# Patient Record
Sex: Female | Born: 2000 | Race: Black or African American | Hispanic: No | Marital: Single | Smoking: Never smoker
Health system: Southern US, Community
[De-identification: ages and names within clinical notes are randomized; demographics above are authoritative.]

## PROBLEM LIST (undated history)

## (undated) DIAGNOSIS — N946 Dysmenorrhea, unspecified: Secondary | ICD-10-CM

## (undated) MED ORDER — ONDANSETRON HCL 4 MG TAB
4 mg | Freq: Three times a day (TID) | ORAL | Status: DC | PRN
Start: ? — End: 2013-11-09

---
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
## 2021-01-06 ENCOUNTER — Telehealth: Payer: Medicaid Other | Admitting: Emergency Medicine

## 2021-01-06 DIAGNOSIS — R0989 Other specified symptoms and signs involving the circulatory and respiratory systems: Secondary | ICD-10-CM

## 2021-01-06 NOTE — Progress Notes (Signed)
Virtual Visit Consent   Emma Stewart, you are scheduled for a virtual visit with a [facility] provider today.     Just as with appointments in the office, your consent must be obtained to participate.  Your consent will be active for this visit and any virtual visit you may have with one of our providers in the next 365 days.     If you have a MyChart account, a copy of this consent can be sent to you electronically.  All virtual visits are billed to your insurance company just like a traditional visit in the office.    As this is a virtual visit, video technology does not allow for your provider to perform a traditional examination.  This may limit your provider's ability to fully assess your condition.  If your provider identifies any concerns that need to be evaluated in person or the need to arrange testing (such as labs, EKG, etc.), we will make arrangements to do so.     Although advances in technology are sophisticated, we cannot ensure that it will always work on either your end or our end.  If the connection with a video visit is poor, the visit may have to be switched to a telephone visit.  With either a video or telephone visit, we are not always able to ensure that we have a secure connection.     I need to obtain your verbal consent now.   Are you willing to proceed with your visit today?    Emma Stewart has provided verbal consent on 01/06/2021 for a virtual visit (video or telephone).   Cathlyn Parsons, NP   Date: 01/06/2021 11:13 AM   Virtual Visit via Video Note   I, Cathlyn Parsons, connected with  Emma Stewart  ([id number], [birth year]) on 01/06/21 at 10:30 AM EST by a video-enabled telemedicine application and verified that I am speaking with the correct person using two identifiers.  Location: Patient: Virtual Visit Location Patient: Home Provider: Virtual Visit Location Provider: Home Office   I discussed the limitations of evaluation and management by telemedicine  and the availability of in person appointments. The patient expressed understanding and agreed to proceed.    History of Present Illness: Emma Stewart is a 20 y.o. who identifies as a female who was assigned female at birth, and is being seen today for cough, congestion, sore throat, body aches, and fever and chills.  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  She was fine until after work on 01/02/2021 when she began having symptoms abruptly.  She suspects she may have influenza.  She has been treating symptoms with Mucinex and hot tea.  She does not have a thermometer but feels she is hot to the touch yet has chills so she believes she has a fever.  Denies shortness of breath or wheezing.  HPI: HPI  Problems: There are no problems to display for this patient.   Allergies: No Known Allergies Medications:  Current Outpatient Medications:    diphenoxylate-atropine (LOMOTIL) 2.5-0.025 MG tablet, Take 1-2 tablets by mouth 4 (four) times daily as needed for diarrhea or loose stools., Disp: 15 tablet, Rfl: 0   hyoscyamine (LEVSIN SL) 0.125 MG SL tablet, Place 1 tablet (0.125 mg total) under the tongue every 4 (four) hours as needed for cramping (abdominal pain)., Disp: 10 tablet, Rfl: 0   omeprazole (PRILOSEC) 40 MG capsule, Take 1 capsule (40 mg total) by mouth daily., Disp: 30 capsule, Rfl:  0   ondansetron (ZOFRAN) 4 MG tablet, Take 1 tablet (4 mg total) by mouth every 6 (six) hours., Disp: 12 tablet, Rfl: 0  Observations/Objective: Patient is well-developed, well-nourished in no acute distress.  Resting comfortably  at home.  Head is normocephalic, atraumatic.  No labored breathing.  Speech is clear and coherent with logical content.  Patient is alert and oriented at baseline.    Assessment and Plan: 1. Suspected novel influenza A virus infection Reviewed supportive care symptoms.  Patient questions when she is able to go back to work.  Patient can go back when she is fever free  for 24 hours and her symptoms have improved.  She does not have a MyChart account so I will send this information via email and she has agreed to receive information this way.  Follow Up Instructions: I discussed the assessment and treatment plan with the patient. The patient was provided an opportunity to ask questions and all were answered. The patient agreed with the plan and demonstrated an understanding of the instructions.  A copy of instructions were sent to the patient via MyChart unless otherwise noted below.   Patient has requested to receive PHI (AVS, Work Notes, etc) pertaining to this video visit through e-mail as they are currently without active MyChart. They have voiced understand that email is not considered secure and their health information could be viewed by someone other than the patient.   The patient was advised to call back or seek an in-person evaluation if the symptoms worsen or if the condition fails to improve as anticipated.  Time:  I spent 10 minutes with the patient via telehealth technology discussing the above problems/concerns.    Cathlyn Parsons, NP

## 2021-01-06 NOTE — Patient Instructions (Signed)
  [name], thank you for joining Cathlyn Parsons, NP for today's virtual visit.  While this provider is not your primary care provider (PCP), if your PCP is located in our provider database this encounter information will be shared with them immediately following your visit.  Consent: (Patient) Emma Stewart provided verbal consent for this virtual visit at the beginning of the encounter.  Current Medications:  Current Outpatient Medications:    diphenoxylate-atropine (LOMOTIL) 2.5-0.025 MG tablet, Take 1-2 tablets by mouth 4 (four) times daily as needed for diarrhea or loose stools., Disp: 15 tablet, Rfl: 0   hyoscyamine (LEVSIN SL) 0.125 MG SL tablet, Place 1 tablet (0.125 mg total) under the tongue every 4 (four) hours as needed for cramping (abdominal pain)., Disp: 10 tablet, Rfl: 0   omeprazole (PRILOSEC) 40 MG capsule, Take 1 capsule (40 mg total) by mouth daily., Disp: 30 capsule, Rfl: 0   ondansetron (ZOFRAN) 4 MG tablet, Take 1 tablet (4 mg total) by mouth every 6 (six) hours., Disp: 12 tablet, Rfl: 0   Medications ordered in this encounter:  No orders of the defined types were placed in this encounter.    *If you need refills on other medications prior to your next appointment, please contact your pharmacy*  Follow-Up: Call back or seek an in-person evaluation if the symptoms worsen or if the condition fails to improve as anticipated.  Other Instructions Keep using Mucinex as directed on the package and drinking hot tea to help manage her symptoms.  I suggest you also add Tylenol and/or ibuprofen as directed on the package as to help manage her fever and any body aches.  Please buy a thermometer and keep track of your temperature. Your temperature needs to be lower than 100 F for 24 hours without needing to take Tylenol or ibuprofen to keep it down before he can go back to work.  Make sure to drink lots of liquids to stay hydrated.   If you have been instructed to have an  in-person evaluation today at a local Urgent Care facility, please use the link below. It will take you to a list of all of our available [facility], including address, phone number and hours of operation. Please do not delay care.  [facility]  If you or a family member do not have a primary care provider, use the link below to schedule a visit and establish care. When you choose a [facility] primary care physician or advanced practice provider, you gain a long-term partner in health. Find a Primary Care Provider  Learn more about [facility] in-office and virtual care options: [facility] - Get Care Now

## 2021-01-07 ENCOUNTER — Ambulatory Visit: Payer: Medicaid Other | Admitting: Gastroenterology

## 2021-03-06 ENCOUNTER — Encounter (HOSPITAL_BASED_OUTPATIENT_CLINIC_OR_DEPARTMENT_OTHER): Payer: Self-pay

## 2021-03-06 ENCOUNTER — Other Ambulatory Visit: Payer: Self-pay

## 2021-03-06 ENCOUNTER — Emergency Department (HOSPITAL_BASED_OUTPATIENT_CLINIC_OR_DEPARTMENT_OTHER): Payer: Medicaid Other

## 2021-03-06 ENCOUNTER — Emergency Department (HOSPITAL_BASED_OUTPATIENT_CLINIC_OR_DEPARTMENT_OTHER)
Admission: EM | Admit: 2021-03-06 | Discharge: 2021-03-06 | Disposition: A | Discharge status: Home / Self Care | Payer: Medicaid Other | Attending: Emergency Medicine | Admitting: Emergency Medicine

## 2021-03-06 ENCOUNTER — Other Ambulatory Visit (HOSPITAL_BASED_OUTPATIENT_CLINIC_OR_DEPARTMENT_OTHER): Payer: Self-pay

## 2021-03-06 DIAGNOSIS — K529 Noninfective gastroenteritis and colitis, unspecified: Secondary | ICD-10-CM | POA: Diagnosis not present

## 2021-03-06 DIAGNOSIS — R197 Diarrhea, unspecified: Secondary | ICD-10-CM | POA: Diagnosis present

## 2021-03-06 DIAGNOSIS — Z20822 Contact with and (suspected) exposure to covid-19: Secondary | ICD-10-CM | POA: Insufficient documentation

## 2021-03-06 LAB — CBC WITH DIFFERENTIAL/PLATELET
Abs Immature Granulocytes: 0.06 10*3/uL (ref 0.00–0.07)
Basophils Absolute: 0 10*3/uL (ref 0.0–0.1)
Basophils Relative: 0 %
Eosinophils Absolute: 0 10*3/uL (ref 0.0–0.5)
Eosinophils Relative: 0 %
HCT: 38.4 % (ref 36.0–46.0)
Hemoglobin: 12.1 g/dL (ref 12.0–15.0)
Immature Granulocytes: 1 %
Lymphocytes Relative: 4 %
Lymphs Abs: 0.4 10*3/uL — ABNORMAL LOW (ref 0.7–4.0)
MCH: 26 pg (ref 26.0–34.0)
MCHC: 31.5 g/dL (ref 30.0–36.0)
MCV: 82.6 fL (ref 80.0–100.0)
Monocytes Absolute: 0.3 10*3/uL (ref 0.1–1.0)
Monocytes Relative: 3 %
Neutro Abs: 8.3 10*3/uL — ABNORMAL HIGH (ref 1.7–7.7)
Neutrophils Relative %: 92 %
Platelets: 362 10*3/uL (ref 150–400)
RBC: 4.65 MIL/uL (ref 3.87–5.11)
RDW: 15 % (ref 11.5–15.5)
WBC: 9 10*3/uL (ref 4.0–10.5)
nRBC: 0 % (ref 0.0–0.2)

## 2021-03-06 LAB — URINALYSIS, ROUTINE W REFLEX MICROSCOPIC
Bilirubin Urine: NEGATIVE
Glucose, UA: NEGATIVE mg/dL
Hgb urine dipstick: NEGATIVE
Ketones, ur: NEGATIVE mg/dL
Leukocytes,Ua: NEGATIVE
Nitrite: NEGATIVE
Protein, ur: NEGATIVE mg/dL
Specific Gravity, Urine: 1.02 (ref 1.005–1.030)
pH: 8.5 — ABNORMAL HIGH (ref 5.0–8.0)

## 2021-03-06 LAB — COMPREHENSIVE METABOLIC PANEL
ALT: 17 U/L (ref 0–44)
AST: 38 U/L (ref 15–41)
Albumin: 4.1 g/dL (ref 3.5–5.0)
Alkaline Phosphatase: 59 U/L (ref 38–126)
Anion gap: 10 (ref 5–15)
BUN: 11 mg/dL (ref 6–20)
CO2: 22 mmol/L (ref 22–32)
Calcium: 9.3 mg/dL (ref 8.9–10.3)
Chloride: 103 mmol/L (ref 98–111)
Creatinine, Ser: 0.86 mg/dL (ref 0.44–1.00)
GFR, Estimated: >60 mL/min (ref >60)
Glucose, Bld: 131 mg/dL — ABNORMAL HIGH (ref 70–99)
Potassium: 4.7 mmol/L (ref 3.5–5.1)
Sodium: 135 mmol/L (ref 135–145)
Total Bilirubin: 1.6 mg/dL — ABNORMAL HIGH (ref 0.3–1.2)
Total Protein: 8 g/dL (ref 6.5–8.1)

## 2021-03-06 LAB — RESP PANEL BY RT-PCR (FLU A&B, COVID) ARPGX2
Influenza A by PCR: NEGATIVE
Influenza B by PCR: NEGATIVE
SARS Coronavirus 2 by RT PCR: NEGATIVE

## 2021-03-06 LAB — LIPASE, BLOOD: Lipase: 24 U/L (ref 11–51)

## 2021-03-06 LAB — PREGNANCY, URINE: Preg Test, Ur: NEGATIVE

## 2021-03-06 MED ORDER — SODIUM CHLORIDE 0.9 % IV BOLUS
500.0000 mL | Freq: Once | INTRAVENOUS | Status: AC
  Administered 2021-03-06: 500 mL via INTRAVENOUS

## 2021-03-06 MED ORDER — ONDANSETRON HCL 4 MG/2ML IJ SOLN
4.0000 mg | Freq: Once | INTRAMUSCULAR | Status: AC
  Administered 2021-03-06: 4 mg via INTRAVENOUS
  Filled 2021-03-06: qty 2

## 2021-03-06 MED ORDER — ONDANSETRON 8 MG PO TBDP
8.0000 mg | ORAL_TABLET | Freq: Three times a day (TID) | ORAL | 0 refills | Status: DC | PRN
  Filled 2021-03-06: qty 20, 7d supply, fill #0

## 2021-03-06 NOTE — ED Triage Notes (Signed)
Pt c/o abd pain, n/v/d started 3am-NAD-steady gait

## 2021-03-06 NOTE — ED Provider Notes (Signed)
[facility] EMERGENCY DEPARTMENT Provider Note   CSN: [id number] Arrival date & time: 03/06/21  1129     History  Chief Complaint  Patient presents with   Abdominal Pain    Emma Stewart is a 21 y.o. female.   Abdominal Pain Associated symptoms: cough, diarrhea, fever, nausea and vomiting   Associated symptoms: no dysuria    This is a 21 year old female presenting due to nausea, vomiting, diarrhea.  Started acutely at 3 AM, the symptoms have been intermittent but frequent since then.  She has pain in her abdomen when she vomits.  Not on any birth control, denies any sexual activity.  Went out to [employer] had a poke bowl last night, nobody around her is sick. Has not tried any at home medication.   Patient states she had flulike symptoms about 3 weeks ago that have been persistent since then.  Consistent cough, body aches, intermittent fevers.  Mother is concerned about pneumonia, patient denies any chest pain or feeling short of breath.  Social history: Occasional marijuana use.  Denies any tobacco use or alcohol use.  No surgical history, no prior abdominal surgeries.  Home Medications Prior to Admission medications   Medication Sig Start Date End Date Taking? Authorizing Provider  ondansetron (ZOFRAN-ODT) 8 MG disintegrating tablet Take 1 tablet (8 mg total) by mouth every 8 (eight) hours as needed for nausea or vomiting. 03/06/21  Yes Theron Arista, PA-C  diphenoxylate-atropine (LOMOTIL) 2.5-0.025 MG tablet Take 1-2 tablets by mouth 4 (four) times daily as needed for diarrhea or loose stools. 08/18/20   Gilda Crease, MD  hyoscyamine (LEVSIN SL) 0.125 MG SL tablet Place 1 tablet (0.125 mg total) under the tongue every 4 (four) hours as needed for cramping (abdominal pain). 08/18/20   Gilda Crease, MD  omeprazole (PRILOSEC) 40 MG capsule Take 1 capsule (40 mg total) by mouth daily. 11/23/20 12/23/20  Jeannie Fend, PA-C  ondansetron (ZOFRAN) 4 MG  tablet Take 1 tablet (4 mg total) by mouth every 6 (six) hours. 08/18/20   Gilda Crease, MD      Allergies    Patient has no known allergies.    Review of Systems   Review of Systems  Constitutional:  Positive for fever.  HENT:  Positive for congestion.   Respiratory:  Positive for cough.   Gastrointestinal:  Positive for abdominal pain, diarrhea, nausea and vomiting.  Genitourinary:  Negative for dysuria, menstrual problem and pelvic pain.  Musculoskeletal:  Positive for myalgias.  Neurological:  Negative for syncope.   Physical Exam Updated Vital Signs BP 119/61    Pulse 93    Temp 98.2 F (36.8 C) (Oral)    Resp 18    Ht 5\' 3"  (1.6 m)    Wt 86.6 kg    LMP 02/19/2021    SpO2 99%    BMI 33.83 kg/m  Physical Exam Vitals and nursing note reviewed.  Constitutional:      General: She is not in acute distress.    Appearance: She is well-developed.  HENT:     Head: Normocephalic and atraumatic.  Eyes:     Conjunctiva/sclera: Conjunctivae normal.  Cardiovascular:     Rate and Rhythm: Normal rate and regular rhythm.     Heart sounds: No murmur heard. Pulmonary:     Effort: Pulmonary effort is normal. No respiratory distress.     Breath sounds: Normal breath sounds.     Comments: Slightly decreased breath sounds likely secondary to  effort. No wheezes, rales, rhonchi. Abdominal:     Palpations: Abdomen is soft.     Tenderness: There is generalized abdominal tenderness.  Musculoskeletal:        General: No swelling.     Cervical back: Neck supple.  Skin:    General: Skin is warm and dry.     Capillary Refill: Capillary refill takes less than 2 seconds.  Neurological:     Mental Status: She is alert.  Psychiatric:        Mood and Affect: Mood normal.   ED Results / Procedures / Treatments   Labs (all labs ordered are listed, but only abnormal results are displayed) Labs Reviewed  CBC WITH DIFFERENTIAL/PLATELET - Abnormal; Notable for the following components:       Result Value   Neutro Abs 8.3 (*)    Lymphs Abs 0.4 (*)    All other components within normal limits  COMPREHENSIVE METABOLIC PANEL - Abnormal; Notable for the following components:   Glucose, Bld 131 (*)    Total Bilirubin 1.6 (*)    All other components within normal limits  URINALYSIS, ROUTINE W REFLEX MICROSCOPIC - Abnormal; Notable for the following components:   APPearance HAZY (*)    pH 8.5 (*)    All other components within normal limits  RESP PANEL BY RT-PCR (FLU A&B, COVID) ARPGX2  LIPASE, BLOOD  PREGNANCY, URINE    EKG None  Radiology DG Chest Portable 1 View  Result Date: 03/06/2021 CLINICAL DATA:  Cough. Abdominal pain, nausea and vomiting with diarrhea. EXAM: PORTABLE CHEST 1 VIEW COMPARISON:  None. FINDINGS: The heart size and mediastinal contours are within normal limits. Both lungs are clear. The visualized skeletal structures are unremarkable. IMPRESSION: Normal examination. Electronically Signed   By: Beckie SaltsSteven  Reid M.D.   On: 03/06/2021 12:25    Procedures Procedures    Medications Ordered in ED Medications  sodium chloride 0.9 % bolus 500 mL (0 mLs Intravenous Stopped 03/06/21 1314)  ondansetron (ZOFRAN) injection 4 mg (4 mg Intravenous Given 03/06/21 1210)    ED Course/ Medical Decision Making/ A&P                           Medical Decision Making  This is a 21 year old female presenting with nausea, diarrhea, vomiting, diffuse abdominal pain times less than 12 hours.  Presentation seems consistent with a gastroenteritis, her vitals are stable and there is no focal tenderness on exam.  She is not tachycardic or febrile or hypoxic, not having any chest pain or shortness of breath.  Given that she has been having cough and flu symptoms for greater than 3 weeks we will get a chest x-ray to evaluate for pneumonia.  We will also treat with fluids and nausea medicine and check abdominal labs for gross electrolyte derangement as well as signs for infection, AKI or  LFT derangement.  Do not think she needs imaging at this time, will continue to do serial abdominal exams.  I considered cannabis hyperemesis given daily marijuana use, however in the context of diarrhea and vomiting and new food ingestion I suspect gastroenteritis is more likely.  Additional history obtained: -Additional history obtained from patient mother who is at bedside. -External records from outside source obtained and reviewed including: Chart review including previous notes, labs, imaging, consultation notes   Lab Tests: -I ordered, reviewed, and interpreted labs.  The pertinent results include:   CBC without any leukocytosis or anemia. CMP  does not show any AKI, electrolyte derangement, LFT derangement.  Total bilirubin is slightly elevated but there is no LFT pattern concerning for an acute hepatitis.  Do not think this needs emergent work-up at this time. UA unremarkable, patient is not pregnant. Respiratory panel: COVID and flu negative. Lipase: Negative, also not consistent with her presentation given she is not an alcohol drinker.  Imaging Studies ordered: -I ordered imaging studies including chest x-ray -I independently visualized and interpreted imaging which showed normal cardiac silhouette without any evidence of pneumonia. -I agree with the radiologist interpretation   Medicines ordered and prescription drug management: -I ordered medication including Zofran for nausea.  Normal saline for fluid loss replenishment. -Reevaluation of the patient after these medicines showed that the patient resolved -I have reviewed the patients home medicines and have made adjustments as needed  Reevaluation: After the interventions noted above, I reevaluated the patient and found that they have :resolved Serial abdominal exams with out any focal tenderness, abdomen remained soft without peritoneal signs concerning for acute abdomen.  Reassuring. Patient passed p.o.  challenge.  Dispostion: Zofran prescribed for nausea.  Work note provided.  Patient discharged with dietary recommendations, return precautions in stable condition.        Final Clinical Impression(s) / ED Diagnoses Final diagnoses:  Gastroenteritis    Rx / DC Orders ED Discharge Orders          Ordered    ondansetron (ZOFRAN-ODT) 8 MG disintegrating tablet  Every 8 hours PRN        03/06/21 1338              Theron Arista, PA-C 03/06/21 1346    Curatolo, Adam, DO 03/06/21 1400

## 2021-03-06 NOTE — Discharge Instructions (Addendum)
Your work-up today was reassuring, no signs of infection or pneumonia on your chest x-ray.  Drink plenty of clear liquid and eat a plain diet until you feel better. Take zofran every 8 hours as needed for nausea. Stay out of wok until diarrhea free for 24 hours.

## 2021-03-27 ENCOUNTER — Encounter: Payer: Self-pay | Admitting: Nurse Practitioner

## 2021-04-10 ENCOUNTER — Other Ambulatory Visit: Payer: Self-pay

## 2021-04-11 ENCOUNTER — Other Ambulatory Visit: Payer: Self-pay

## 2021-04-13 NOTE — Progress Notes (Addendum)
04/13/2021 Danie Binder [id number] 2000/11/27   CHIEF COMPLAINT: Abdominal pain, diarrhea  HISTORY OF PRESENT ILLNESS: Lisandra Mathisen is a 21 year old female with a past medical history of anxiety, depression and chronic headaches. She presents to our office today as referred by Suella Broad PA-C for further evaluation regarding upper abdominal pain, nausea/vomiting and diarrhea.  She is accompanied by her mother.  She reports having stomach issues since the age 86 including upper abdominal pain, intermittent nausea, vomiting and diarrhea which initially occurred during her menstrual cycle.  However, over the past few years these symptoms have worsened.  She often has urgent diarrhea bowel movements triggered by eating.  No specific food triggers.  Her episodes of N/V are intermittent, typically occur once or twice monthly.  She vomited orange bilious emesis x 5 episodes over the past 3 weeks.  No coffee-ground or frank hematemesis.  She is taking Zofran 4 mg daily and Phenergan as needed.  No dysphagia or heartburn.  Her upper abdominal pain and diarrhea occur daily for the past year.  She has intermittent lower abdominal pain when passing a diarrhea bowel movement.  She cannot recall the last time she passed a solid stool.  No rectal bleeding or black stools.  She has a history of headaches and often has nausea before or during the duration of her headaches.  She intends to see neurologist to verify if she has migraine headaches.  No NSAID use.  She smokes marijuana twice weekly for the past year.  No alcohol use. She was previously on Prozac for anxiety and depression and she stated her GI symptoms did not improve or worsen when she was on Prozac.  She stopped taking Prozac approximately 6 months ago and feels her anxiety and depression are well controlled at this time.   She presented to the ED 11/23/2020 due to having worsening N/V, epigastric pain and diarrhea.  Labs showed a sodium level 139.   Potassium 3.5.  BUN 7.  Creatinine 0.82.  AST 14.  ALT 8.  Total bili 0.3.  Alk phos 60.  WBC 5.9.  Hemoglobin 12.0.  She was prescribed Omeprazole 12m QD and a GI evaluation was recommended.   She presented to the ED on 03/06/2021 with N/V/D and abdominal pain which started acutely at 3 AM..  Labs in the ED showed a total bilirubin level 1.6.  Alk phos 59.  AST 38.  ALT 17.  Lipase 24.  Glucose 131.  WBC 9.0.  Hemoglobin 12.1.  Urine pregnancy test was negative.  She also reported having a cough and flulike symptoms.  COVID and influenza testing were negative.  A chest x-ray was negative.  She was prescribed Levsin, Lomotil, Zofran with instructions to continue Omeprazole 40 mg daily.  Abdominal imaging was not done.  Omeprazole was ineffective.   CBC Latest Ref Rng & Units 03/06/2021 11/23/2020 08/18/2020  WBC 4.0 - 10.5 K/uL 9.0 5.9 5.9  Hemoglobin 12.0 - 15.0 g/dL 12.1 12.0 12.2  Hematocrit 36.0 - 46.0 % 38.4 38.0 39.8  Platelets 150 - 400 K/uL 362 358 354    CMP Latest Ref Rng & Units 03/06/2021 11/23/2020 08/18/2020  Glucose 70 - 99 mg/dL 131(H) 95 116(H)  BUN 6 - 20 mg/dL _0 Creatinine 0.44 - 1.00 mg/dL 0.86 0.82 0.78  Sodium 135 - 145 mmol/L 135 139 141  Potassium 3.5 - 5.1 mmol/L 4.7 3.5 4.0  Chloride 98 - 111 mmol/L 103 104 108  CO2 22 - 32 mmol/L _0 Calcium 8.9 - 10.3 mg/dL 9.3 9.8 9.6  Total Protein 6.5 - 8.1 g/dL 8.0 7.7 7.6  Total Bilirubin 0.3 - 1.2 mg/dL 1.6(H) 0.3 0.2(L)  Alkaline Phos 38 - 126 U/L 59 60 59  AST 15 - 41 U/L 38 14(L) 18  ALT 0 - 44 U/L _1 Social History: She is single.  [employer].  No alcohol use. She smokes marijuana a few days weekly x 1. No other drug use.   Family History: Mother age 5 with history of heart disease, hypertension, hypercholesterolemia and osteoarthritis. Father's history unknown. Sister 24 PCOS. Sister 23 healthy.  No Known Allergies    Outpatient Encounter Medications as of 04/14/2021  Medication  Sig   diphenoxylate-atropine (LOMOTIL) 2.5-0.025 MG tablet Take 1-2 tablets by mouth 4 (four) times daily as needed for diarrhea or loose stools.   hyoscyamine (LEVSIN SL) 0.125 MG SL tablet Place 1 tablet (0.125 mg total) under the tongue every 4 (four) hours as needed for cramping (abdominal pain).   omeprazole (PRILOSEC) 40 MG capsule Take 1 capsule (40 mg total) by mouth daily.   ondansetron (ZOFRAN-ODT) 8 MG disintegrating tablet Take 1 tablet (8 mg total) by mouth every 8 (eight) hours as needed for nausea or vomiting.   promethazine (PHENERGAN) 12.5 MG tablet Take 12.5 mg by mouth.   No facility-administered encounter medications on file as of 04/14/2021.    REVIEW OF SYSTEMS:  Gen: Denies fever, sweats or chills. No weight loss.  CV: Denies chest pain, palpitations or edema. Resp: Denies cough, shortness of breath of hemoptysis.  GI: See HPI. GU : Denies urinary burning, blood in urine, increased urinary frequency or incontinence. MS: Denies joint pain, muscles aches or weakness. Derm: Denies rash, itchiness, skin lesions or unhealing ulcers. Psych:+ Anxiety and depression.   Heme: Denies bruising, easy bleeding. Neuro: + Headaches. Endo:  Denies any problems with DM, thyroid or adrenal function.  PHYSICAL EXAM: BP 102/60    Pulse 81    Ht _2  (1.626 m)    Wt 195 lb (88.5 kg)    SpO2 99%    BMI 33.47 kg/m   General: 21 year old female in no acute distress. Head: Normocephalic and atraumatic. Eyes:  Sclerae non-icteric, conjunctive pink. Ears: Normal auditory acuity. Mouth: Dentition intact. No ulcers or lesions.  Neck: Supple, no lymphadenopathy or thyromegaly.  Lungs: Clear bilaterally to auscultation without wheezes, crackles or rhonchi. Heart: Regular rate and rhythm. No murmur, rub or gallop appreciated.  Abdomen: Soft, non distended.  Mild epigastric tenderness without rebound or guarding.  No masses. No hepatosplenomegaly. Normoactive bowel sounds x 4 quadrants.   Rectal: Deferred. Musculoskeletal: Symmetrical with no gross deformities. Skin: Warm and dry. No rash or lesions on visible extremities. Extremities: No edema. Neurological: Alert oriented x 4, no focal deficits.  Psychological:  Alert and cooperative. Normal mood and affect.  ASSESSMENT AND PLAN:  31) 21 year old female with chronic episodic epigastric pain with N/V.  Mildly elevated total bilirubin level 1.6.  Normal alk phos, AST and ALT levels. -RUQ sonogram to evaluate the gallbladder -Famotidine 20 mg 1 p.o. daily, patient to start after H. pylori stool test submitted to the lab -H. pylori stool antigen -Eventual EGD -Ondansetron 4 mg p.o. every 6 to 8 hours as needed. -Patient to contact her office if her symptoms worsen.  Repeat hepatic panel if vomiting recurs. -Stop marijuana use -Avoid spicy and greasy foods -  Patient reported completing laboratory studies including a CBC, CMP and TSH level by her PCP 1 or 2 weeks ago, I have requested a copy of these results for further review  2) Chronic diarrhea with intermittent lower abdominal pain  -Fecal calprotectin level -Dicyclomine 10 mg 1 p.o. 3 times daily, may take 30 minutes prior to meals -Eventual diagnostic colonoscopy rule out colitis/IBD  Further recommendations to be determined after the above lab and sonogram results reviewed  Danie Binder will follow-up with Dr. Lorenso Courier in 4 weeks to further discuss scheduling an EGD and colonoscopy.    CC:  Benito Mccreedy, MD

## 2021-04-14 ENCOUNTER — Ambulatory Visit (INDEPENDENT_AMBULATORY_CARE_PROVIDER_SITE_OTHER): Payer: Medicaid Other | Admitting: Nurse Practitioner

## 2021-04-14 ENCOUNTER — Encounter: Payer: Self-pay | Admitting: Nurse Practitioner

## 2021-04-14 ENCOUNTER — Other Ambulatory Visit: Payer: Medicaid Other

## 2021-04-14 VITALS — BP 102/60 | HR 81 | Ht 64.0 in | Wt 195.0 lb

## 2021-04-14 DIAGNOSIS — R197 Diarrhea, unspecified: Secondary | ICD-10-CM | POA: Diagnosis not present

## 2021-04-14 DIAGNOSIS — K529 Noninfective gastroenteritis and colitis, unspecified: Secondary | ICD-10-CM

## 2021-04-14 DIAGNOSIS — R1010 Upper abdominal pain, unspecified: Secondary | ICD-10-CM

## 2021-04-14 DIAGNOSIS — R112 Nausea with vomiting, unspecified: Secondary | ICD-10-CM | POA: Diagnosis not present

## 2021-04-14 MED ORDER — DICYCLOMINE HCL 10 MG PO CAPS: 10.0000 mg | ORAL_CAPSULE | Freq: Three times a day (TID) | ORAL | 0 refills | Status: AC | PRN

## 2021-04-14 MED ORDER — FAMOTIDINE 20 MG PO TABS: 20.0000 mg | ORAL_TABLET | Freq: Every day | ORAL | 1 refills | Status: AC

## 2021-04-14 NOTE — Progress Notes (Signed)
RADIOLOGY SCHEDULING REQUEST FOR RUQ US °[facility] Scheduling via secure staff message. ° ° ° °

## 2021-04-14 NOTE — Patient Instructions (Addendum)
LABS:   Please proceed to the basement level to pick up stools tests before leaving today. Press "B" on the elevator. The lab is located at the first door on the left as you exit the elevator.  IMAGING: You will be contacted by [facility] Scheduling (Your caller ID will indicate phone # [phone]) in the next 7 days to schedule your Abdominal ultrasound. If you have not heard from them within 7 business days, please call [facility] Scheduling at [phone] to follow up on the status of your appointment.    MEDICATION: We have sent the following medication to your pharmacy for you to pick up at your convenience: Famotidine 20 MG tablet. Take 1 tablet daily after you have sent in your stool tests. Dicyclomine 10 MG tablet, take 1 tablet three times a day as needed for abdominal pain/cramping. You may take this 3o minutes before meals as needed.  RECOMMENDATIONS:  Stop marijuana use. Follow up with Dr. Leonides Schanz.  We have scheduled you a follow up with Dr. Leonides Schanz on 05/21/21 at 3:40pm.  BMI:  If you are age 16 or older, your body mass index should be between 23-30. Your Body mass index is 33.47 kg/m. If this is out of the aforementioned range listed, please consider follow up with your Primary Care Provider.  If you are age 8 or younger, your body mass index should be between 19-25. Your Body mass index is 33.47 kg/m. If this is out of the aformentioned range listed, please consider follow up with your Primary Care Provider.   MY CHART:  The [place] [facility] providers would like to encourage you to use [facility] to communicate with providers for non-urgent requests or questions.  Due to long hold times on the telephone, sending your provider a message by [facility] may be a faster and more efficient way to get a response.  Please allow 48 business hours for a response.  Please remember that this is for non-urgent requests.   Thank you for trusting me with your gastrointestinal  care!    Arnaldo Natal, CRNP

## 2021-04-22 ENCOUNTER — Ambulatory Visit (HOSPITAL_COMMUNITY): Payer: Medicaid Other

## 2021-04-23 ENCOUNTER — Telehealth: Payer: Self-pay | Admitting: Internal Medicine

## 2021-04-23 NOTE — Telephone Encounter (Signed)
Called patient, no answer and VM is full. ?  ?If she calls back before I can get in touch, Please have patient call radiology at [phone] to reschedule her ultrasound ?

## 2021-04-23 NOTE — Telephone Encounter (Signed)
Patient called to reschedule US.

## 2021-04-29 NOTE — Telephone Encounter (Signed)
Called patient, no answer and VM is full. ?  ?If she calls back before I can get in touch, Please have patient call radiology at [phone] to reschedule her ultrasound ?

## 2021-05-05 ENCOUNTER — Ambulatory Visit (HOSPITAL_COMMUNITY): Payer: Medicaid Other

## 2021-05-21 ENCOUNTER — Ambulatory Visit: Payer: Medicaid Other | Admitting: Internal Medicine

## 2021-06-06 ENCOUNTER — Ambulatory Visit: Payer: Medicaid Other | Admitting: Internal Medicine

## 2022-04-17 ENCOUNTER — Emergency Department (HOSPITAL_COMMUNITY)
Admission: EM | Admit: 2022-04-17 | Discharge: 2022-04-18 | Disposition: A | Discharge status: Home / Self Care | Payer: Medicaid Other | Attending: Emergency Medicine | Admitting: Emergency Medicine

## 2022-04-17 ENCOUNTER — Other Ambulatory Visit: Payer: Self-pay

## 2022-04-17 ENCOUNTER — Encounter (HOSPITAL_COMMUNITY): Payer: Self-pay | Admitting: Emergency Medicine

## 2022-04-17 DIAGNOSIS — R519 Headache, unspecified: Secondary | ICD-10-CM | POA: Diagnosis not present

## 2022-04-17 DIAGNOSIS — R197 Diarrhea, unspecified: Secondary | ICD-10-CM | POA: Diagnosis not present

## 2022-04-17 DIAGNOSIS — R1084 Generalized abdominal pain: Secondary | ICD-10-CM | POA: Insufficient documentation

## 2022-04-17 DIAGNOSIS — R112 Nausea with vomiting, unspecified: Secondary | ICD-10-CM | POA: Diagnosis not present

## 2022-04-17 DIAGNOSIS — K529 Noninfective gastroenteritis and colitis, unspecified: Secondary | ICD-10-CM

## 2022-04-17 LAB — CBC WITH DIFFERENTIAL/PLATELET
Abs Immature Granulocytes: 0.02 10*3/uL (ref 0.00–0.07)
Basophils Absolute: 0 10*3/uL (ref 0.0–0.1)
Basophils Relative: 0 %
Eosinophils Absolute: 0 10*3/uL (ref 0.0–0.5)
Eosinophils Relative: 0 %
HCT: 38.4 % (ref 36.0–46.0)
Hemoglobin: 12.1 g/dL (ref 12.0–15.0)
Immature Granulocytes: 0 %
Lymphocytes Relative: 5 %
Lymphs Abs: 0.6 10*3/uL — ABNORMAL LOW (ref 0.7–4.0)
MCH: 26.8 pg (ref 26.0–34.0)
MCHC: 31.5 g/dL (ref 30.0–36.0)
MCV: 85.1 fL (ref 80.0–100.0)
Monocytes Absolute: 0.2 10*3/uL (ref 0.1–1.0)
Monocytes Relative: 2 %
Neutro Abs: 10.4 10*3/uL — ABNORMAL HIGH (ref 1.7–7.7)
Neutrophils Relative %: 93 %
Platelets: 343 10*3/uL (ref 150–400)
RBC: 4.51 MIL/uL (ref 3.87–5.11)
RDW: 14 % (ref 11.5–15.5)
WBC: 11.2 10*3/uL — ABNORMAL HIGH (ref 4.0–10.5)
nRBC: 0 % (ref 0.0–0.2)

## 2022-04-17 LAB — COMPREHENSIVE METABOLIC PANEL
ALT: 19 U/L (ref 0–44)
AST: 29 U/L (ref 15–41)
Albumin: 4.2 g/dL (ref 3.5–5.0)
Alkaline Phosphatase: 58 U/L (ref 38–126)
Anion gap: 10 (ref 5–15)
BUN: 11 mg/dL (ref 6–20)
CO2: 23 mmol/L (ref 22–32)
Calcium: 9.2 mg/dL (ref 8.9–10.3)
Chloride: 103 mmol/L (ref 98–111)
Creatinine, Ser: 0.93 mg/dL (ref 0.44–1.00)
GFR, Estimated: >60 mL/min (ref >60)
Glucose, Bld: 109 mg/dL — ABNORMAL HIGH (ref 70–99)
Potassium: 3.7 mmol/L (ref 3.5–5.1)
Sodium: 136 mmol/L (ref 135–145)
Total Bilirubin: 0.9 mg/dL (ref 0.3–1.2)
Total Protein: 7.3 g/dL (ref 6.5–8.1)

## 2022-04-17 LAB — LIPASE, BLOOD: Lipase: 71 U/L — ABNORMAL HIGH (ref 11–51)

## 2022-04-17 MED ORDER — DIPHENHYDRAMINE HCL 25 MG PO CAPS
25.0000 mg | ORAL_CAPSULE | Freq: Once | ORAL | Status: AC
  Administered 2022-04-17: 25 mg via ORAL
  Filled 2022-04-17: qty 1

## 2022-04-17 MED ORDER — METOCLOPRAMIDE HCL 5 MG/ML IJ SOLN
10.0000 mg | Freq: Once | INTRAMUSCULAR | Status: AC
  Administered 2022-04-17: 10 mg via INTRAVENOUS
  Filled 2022-04-17: qty 2

## 2022-04-17 MED ORDER — ONDANSETRON HCL 4 MG/2ML IJ SOLN
4.0000 mg | Freq: Once | INTRAMUSCULAR | Status: AC
  Administered 2022-04-17: 4 mg via INTRAVENOUS
  Filled 2022-04-17: qty 2

## 2022-04-17 MED ORDER — SODIUM CHLORIDE 0.9 % IV BOLUS
1000.0000 mL | Freq: Once | INTRAVENOUS | Status: AC
  Administered 2022-04-17: 1000 mL via INTRAVENOUS

## 2022-04-17 MED ORDER — DICYCLOMINE HCL 10 MG PO CAPS
10.0000 mg | ORAL_CAPSULE | Freq: Once | ORAL | Status: AC
  Administered 2022-04-17: 10 mg via ORAL
  Filled 2022-04-17: qty 1

## 2022-04-17 MED ORDER — BUTALBITAL-APAP-CAFFEINE 50-325-40 MG PO TABS
1.0000 | ORAL_TABLET | Freq: Once | ORAL | Status: AC
  Administered 2022-04-17: 1 via ORAL
  Filled 2022-04-17: qty 1

## 2022-04-17 NOTE — ED Provider Notes (Signed)
[place] AT [facility] Provider Note   CSN: [id number] Arrival date & time: 04/17/22  2148     History  Chief Complaint  Patient presents with   Emesis    Emma Stewart is a 22 y.o. female. With no reported past medical history who presents to the emergency department with emesis.  States that symptoms began at [place] she has had >10 episodes of nausea and vomiting, >10 episodes of nonbloody diarrhea. She describes having generalized abdominal cramping. She states she has not been able to tolerate PO today. She additionally states that she has started to have migraine headache since onset of symptoms. She has history of same. She denies having fever, dysuria, vaginal discharge. She denies possibly contaminated foods. She denies recent antibiotic use or travel. States that her roommate had same illness about 48 hours prior.    Emesis Associated symptoms: abdominal pain, diarrhea and headaches        Home Medications Prior to Admission medications   Medication Sig Start Date End Date Taking? Authorizing Provider  dicyclomine (BENTYL) 10 MG capsule Take 1 capsule (10 mg total) by mouth 3 (three) times daily as needed for spasms (abdominal pain). 04/14/21   Noralyn Pick, NP  diphenoxylate-atropine (LOMOTIL) 2.5-0.025 MG tablet Take 1-2 tablets by mouth 4 (four) times daily as needed for diarrhea or loose stools. 08/18/20   Orpah Greek, MD  famotidine (PEPCID) 20 MG tablet Take 1 tablet (20 mg total) by mouth at bedtime. 04/14/21   Noralyn Pick, NP  omeprazole (PRILOSEC) 40 MG capsule Take 1 capsule (40 mg total) by mouth daily. 11/23/20 12/23/20  Tacy Learn, PA-C  ondansetron (ZOFRAN-ODT) 8 MG disintegrating tablet Take 1 tablet (8 mg total) by mouth every 8 (eight) hours as needed for nausea or vomiting. 03/06/21   Sherrill Raring, PA-C  promethazine (PHENERGAN) 12.5 MG tablet Take 12.5 mg by mouth. 03/28/21    [provider]      Allergies    Patient has no known allergies.    Review of Systems   Review of Systems  Constitutional:  Positive for appetite change.  Gastrointestinal:  Positive for abdominal pain, diarrhea, nausea and vomiting.  Neurological:  Positive for headaches.  All other systems reviewed and are negative.   Physical Exam Updated Vital Signs BP (!) 216/91 (BP Location: Right Arm)   Pulse 91   Temp 98.9 F (37.2 C) (Oral)   Resp 18   SpO2 99%  Physical Exam Vitals and nursing note reviewed.  Constitutional:      General: She is not in acute distress.    Appearance: Normal appearance. She is ill-appearing. She is not toxic-appearing.  HENT:     Head: Normocephalic.     Mouth/Throat:     Mouth: Mucous membranes are moist.     Pharynx: Oropharynx is clear.  Eyes:     General: No scleral icterus. Cardiovascular:     Rate and Rhythm: Normal rate and regular rhythm.     Pulses: Normal pulses.     Heart sounds: No murmur heard. Pulmonary:     Effort: Pulmonary effort is normal. No respiratory distress.     Breath sounds: Normal breath sounds.  Abdominal:     General: Abdomen is protuberant. Bowel sounds are normal. There is no distension.     Palpations: Abdomen is soft.     Tenderness: There is generalized abdominal tenderness. There is no guarding or rebound.  Musculoskeletal:  Cervical back: Neck supple.  Skin:    General: Skin is warm and dry.     Capillary Refill: Capillary refill takes less than 2 seconds.  Neurological:     General: No focal deficit present.     Mental Status: She is alert.  Psychiatric:        Mood and Affect: Mood normal.        Behavior: Behavior normal.     ED Results / Procedures / Treatments   Labs (all labs ordered are listed, but only abnormal results are displayed) Labs Reviewed  COMPREHENSIVE METABOLIC PANEL  LIPASE, BLOOD  CBC WITH DIFFERENTIAL/PLATELET  URINALYSIS, ROUTINE W REFLEX MICROSCOPIC   I-STAT BETA HCG BLOOD, ED ([facility], [facility], [facility] ONLY)    EKG None  Radiology No results found.  Procedures Procedures   Medications Ordered in ED Medications  sodium chloride 0.9 % bolus 1,000 mL (has no administration in time range)  ondansetron (ZOFRAN) injection 4 mg (has no administration in time range)  dicyclomine (BENTYL) capsule 10 mg (has no administration in time range)  butalbital-acetaminophen-caffeine (FIORICET) 50-325-40 MG per tablet 1 tablet (has no administration in time range)  diphenhydrAMINE (BENADRYL) capsule 25 mg (has no administration in time range)    ED Course/ Medical Decision Making/ A&P   {   Click here for ABCD2, HEART and other calculatorsREFRESH Note before signing :1}                          Medical Decision Making Amount and/or Complexity of Data Reviewed Labs: ordered.  Risk Prescription drug management.  Initial Impression and Ddx *** Patient PMH that increases complexity of ED encounter:  ***  Interpretation of Diagnostics I independent reviewed and interpreted the labs as followed: ***  - I independently visualized the following imaging with scope of interpretation limited to determining acute life threatening conditions related to emergency care: ***, which revealed ***  Patient Reassessment and Ultimate Disposition/Management ***  Patient management required discussion with the following services or consulting groups:  {BEROCONSULT:26841}  Complexity of Problems Addressed {BEROCOPA:26833}  Additional Data Reviewed and Analyzed Further history obtained from: {BERODATA:26834}  Patient Encounter Risk Assessment {BERORISK:26838}  Final Clinical Impression(s) / ED Diagnoses Final diagnoses:  None    Rx / DC Orders ED Discharge Orders     None

## 2022-04-17 NOTE — ED Triage Notes (Signed)
N/V/D and headache since 3 pm today.  Housemate with reportedly same illness 2 days ago.

## 2022-04-18 LAB — URINALYSIS, ROUTINE W REFLEX MICROSCOPIC
Bilirubin Urine: NEGATIVE
Glucose, UA: NEGATIVE mg/dL
Hgb urine dipstick: NEGATIVE
Ketones, ur: 20 mg/dL — AB
Leukocytes,Ua: NEGATIVE
Nitrite: NEGATIVE
Protein, ur: NEGATIVE mg/dL
Specific Gravity, Urine: 1.008 (ref 1.005–1.030)
pH: 8 (ref 5.0–8.0)

## 2022-04-18 LAB — HCG, QUANTITATIVE, PREGNANCY: hCG, Beta Chain, Quant, S: <1 m[IU]/mL (ref <5)

## 2022-04-18 MED ORDER — ONDANSETRON 8 MG PO TBDP
8.0000 mg | ORAL_TABLET | Freq: Three times a day (TID) | ORAL | 0 refills | Status: AC | PRN
Start: 2022-04-18 — End: ?

## 2022-04-18 NOTE — Discharge Instructions (Addendum)
Avoid fried foods, fatty foods, greasy foods, and milk products until symptoms resolve. Drink plenty of clear liquids to prevent dehydration. We recommend the use of Zofran as prescribed for nausea/vomiting. Follow-up with your primary care doctor to ensure resolution of symptoms.

## 2022-04-18 NOTE — ED Provider Notes (Signed)
1:28 AM Patient care assumed from [place], PA-C at change of shift.  In short, patient presenting for vomiting and diarrhea.  Her labs have been reviewed.  She has minimal lipase elevation to 71.  Mild leukocytosis of 11.2.  Suspect leukocytosis to be secondary to stress of ongoing vomiting.  This may have also caused changes to the patient's lipase.  She has preserved electrolytes, liver function, kidney function.  On repeat assessment, she reports that her pain has improved.  She is no longer nauseous.  She has tolerated oral fluids as well as saltine crackers.  Her abdominal exam is nontender.  Abdomen soft without peritoneal signs.  Clinical picture consistent with gastroenteritis.  Plan for discharge for outpatient primary care follow-up.  Given outpatient prescription for Zofran for ongoing nausea management.  Counseled on dietary restrictions until symptoms resolve.  Return precautions discussed and provided. Patient discharged in stable condition with no unaddressed concerns.   Results for orders placed or performed during the hospital encounter of 04/17/22  Comprehensive metabolic panel  Result Value Ref Range   Sodium 136 135 - 145 mmol/L   Potassium 3.7 3.5 - 5.1 mmol/L   Chloride 103 98 - 111 mmol/L   CO2 23 22 - 32 mmol/L   Glucose, Bld 109 (H) 70 - 99 mg/dL   BUN 11 6 - 20 mg/dL   Creatinine, Ser 0.93 0.44 - 1.00 mg/dL   Calcium 9.2 8.9 - 10.3 mg/dL   Total Protein 7.3 6.5 - 8.1 g/dL   Albumin 4.2 3.5 - 5.0 g/dL   AST 29 15 - 41 U/L   ALT 19 0 - 44 U/L   Alkaline Phosphatase 58 38 - 126 U/L   Total Bilirubin 0.9 0.3 - 1.2 mg/dL   GFR, Estimated >60 >60 mL/min   Anion gap 10 5 - 15  Lipase, blood  Result Value Ref Range   Lipase 71 (H) 11 - 51 U/L  CBC with Differential  Result Value Ref Range   WBC 11.2 (H) 4.0 - 10.5 K/uL   RBC 4.51 3.87 - 5.11 MIL/uL   Hemoglobin 12.1 12.0 - 15.0 g/dL   HCT 38.4 36.0 - 46.0 %   MCV 85.1 80.0 - 100.0 fL   MCH 26.8 26.0 - 34.0 pg    MCHC 31.5 30.0 - 36.0 g/dL   RDW 14.0 11.5 - 15.5 %   Platelets 343 150 - 400 K/uL   nRBC 0.0 0.0 - 0.2 %   Neutrophils Relative % 93 %   Neutro Abs 10.4 (H) 1.7 - 7.7 K/uL   Lymphocytes Relative 5 %   Lymphs Abs 0.6 (L) 0.7 - 4.0 K/uL   Monocytes Relative 2 %   Monocytes Absolute 0.2 0.1 - 1.0 K/uL   Eosinophils Relative 0 %   Eosinophils Absolute 0.0 0.0 - 0.5 K/uL   Basophils Relative 0 %   Basophils Absolute 0.0 0.0 - 0.1 K/uL   Immature Granulocytes 0 %   Abs Immature Granulocytes 0.02 0.00 - 0.07 K/uL  Urinalysis, Routine w reflex microscopic -Urine, Clean Catch  Result Value Ref Range   Color, Urine STRAW (A) YELLOW   APPearance CLEAR CLEAR   Specific Gravity, Urine 1.008 1.005 - 1.030   pH 8.0 5.0 - 8.0   Glucose, UA NEGATIVE NEGATIVE mg/dL   Hgb urine dipstick NEGATIVE NEGATIVE   Bilirubin Urine NEGATIVE NEGATIVE   Ketones, ur 20 (A) NEGATIVE mg/dL   Protein, ur NEGATIVE NEGATIVE mg/dL   Nitrite NEGATIVE NEGATIVE  Leukocytes,Ua NEGATIVE NEGATIVE  hCG, quantitative, pregnancy  Result Value Ref Range   hCG, Beta Chain, Quant, S <1 <5 mIU/mL     [name], PA-C 04/18/22 0131    Quintella Reichert, MD 04/18/22 [phone]

## 2022-04-18 NOTE — ED Notes (Signed)
Pt tolerated PO water w/o issue.

## 2022-04-18 NOTE — ED Notes (Signed)
Pt tolerating PO fluids and saltines w/o issue.

## 2022-04-18 NOTE — ED Provider Notes (Incomplete)
[place] [facility] AT [facility] Provider Note   CSN: [id number] Arrival date & time: 04/17/22  2148     History  Chief Complaint  Patient presents with  . Emesis    Emma Stewart is a 22 y.o. female. With no reported past medical history who presents to the emergency department with emesis.  States that symptoms began at [employer] she has had >10 episodes of nausea and vomiting, >10 episodes of nonbloody diarrhea. She describes having generalized abdominal cramping. She states she has not been able to tolerate PO today. She additionally states that she has started to have migraine headache since onset of symptoms. She has history of same. She denies having fever, dysuria, vaginal discharge. She denies possibly contaminated foods. She denies recent antibiotic use or travel. States that her roommate had same illness about 48 hours prior.    Emesis Associated symptoms: abdominal pain, diarrhea and headaches        Home Medications Prior to Admission medications   Medication Sig Start Date End Date Taking? Authorizing Provider  SUMAtriptan (IMITREX) 100 MG tablet Take 100 mg by mouth as directed. 04/02/22  Yes [provider]  dicyclomine (BENTYL) 10 MG capsule Take 1 capsule (10 mg total) by mouth 3 (three) times daily as needed for spasms (abdominal pain). 04/14/21   Noralyn Pick, NP  diphenoxylate-atropine (LOMOTIL) 2.5-0.025 MG tablet Take 1-2 tablets by mouth 4 (four) times daily as needed for diarrhea or loose stools. 08/18/20   Orpah Greek, MD  famotidine (PEPCID) 20 MG tablet Take 1 tablet (20 mg total) by mouth at bedtime. 04/14/21   Noralyn Pick, NP  omeprazole (PRILOSEC) 40 MG capsule Take 1 capsule (40 mg total) by mouth daily. 11/23/20 12/23/20  Tacy Learn, PA-C  ondansetron (ZOFRAN-ODT) 8 MG disintegrating tablet Take 1 tablet (8 mg total) by mouth every 8 (eight) hours as needed for nausea or vomiting.  03/06/21   Sherrill Raring, PA-C  promethazine (PHENERGAN) 12.5 MG tablet Take 12.5 mg by mouth. 03/28/21   [provider]      Allergies    Patient has no known allergies.    Review of Systems   Review of Systems  Constitutional:  Positive for appetite change.  Gastrointestinal:  Positive for abdominal pain, diarrhea, nausea and vomiting.  Neurological:  Positive for headaches.  All other systems reviewed and are negative.   Physical Exam Updated Vital Signs BP 118/64   Pulse 87   Temp 98.9 F (37.2 C) (Oral)   Resp 18   SpO2 100%  Physical Exam Vitals and nursing note reviewed.  Constitutional:      General: She is not in acute distress.    Appearance: Normal appearance. She is ill-appearing. She is not toxic-appearing.  HENT:     Head: Normocephalic.     Mouth/Throat:     Mouth: Mucous membranes are moist.     Pharynx: Oropharynx is clear.  Eyes:     General: No scleral icterus. Cardiovascular:     Rate and Rhythm: Normal rate and regular rhythm.     Pulses: Normal pulses.     Heart sounds: No murmur heard. Pulmonary:     Effort: Pulmonary effort is normal. No respiratory distress.     Breath sounds: Normal breath sounds.  Abdominal:     General: Abdomen is protuberant. Bowel sounds are normal. There is no distension.     Palpations: Abdomen is soft.     Tenderness:  There is generalized abdominal tenderness. There is no guarding or rebound.  Musculoskeletal:     Cervical back: Neck supple.  Skin:    General: Skin is warm and dry.     Capillary Refill: Capillary refill takes less than 2 seconds.  Neurological:     General: No focal deficit present.     Mental Status: She is alert.  Psychiatric:        Mood and Affect: Mood normal.        Behavior: Behavior normal.     ED Results / Procedures / Treatments   Labs (all labs ordered are listed, but only abnormal results are displayed) Labs Reviewed  COMPREHENSIVE METABOLIC PANEL - Abnormal; Notable for  the following components:      Result Value   Glucose, Bld 109 (*)    All other components within normal limits  LIPASE, BLOOD - Abnormal; Notable for the following components:   Lipase 71 (*)    All other components within normal limits  CBC WITH DIFFERENTIAL/PLATELET - Abnormal; Notable for the following components:   WBC 11.2 (*)    Neutro Abs 10.4 (*)    Lymphs Abs 0.6 (*)    All other components within normal limits  URINALYSIS, ROUTINE W REFLEX MICROSCOPIC  HCG, QUANTITATIVE, PREGNANCY    EKG None  Radiology No results found.  Procedures Procedures   Medications Ordered in ED Medications  sodium chloride 0.9 % bolus 1,000 mL (1,000 mLs Intravenous New Bag/Given 04/17/22 2251)  ondansetron (ZOFRAN) injection 4 mg (4 mg Intravenous Given 04/17/22 2251)  dicyclomine (BENTYL) capsule 10 mg (10 mg Oral Given 04/17/22 2256)  butalbital-acetaminophen-caffeine (FIORICET) 50-325-40 MG per tablet 1 tablet (1 tablet Oral Given 04/17/22 2256)  diphenhydrAMINE (BENADRYL) capsule 25 mg (25 mg Oral Given 04/17/22 2251)  metoCLOPramide (REGLAN) injection 10 mg (10 mg Intravenous Given 04/17/22 2357)    ED Course/ Medical Decision Making/ A&P  Medical Decision Making Amount and/or Complexity of Data Reviewed Labs: ordered.  Risk Prescription drug management.  Care of patient being handed off to [facility], PA-C at change of shift.  Briefly, 22 year old female presenting with N/V/D since 4AM today. She is uncomfortable appearing. Non septic, non toxic, hemodynamically stable. I do suspect this to be likely viral gastroenteritis.  She was treated so far with IVF, bentyl, zofran. She had ongoing nausea and was just redosed with Reglan. Additionally having migraine likely 2/2 dehydration/illness. She was given fioricet and benadryl. Labs relatively unremarkable. Lipase is mildly elevated, consistent with her N/V. No electrolyte dysfunction. Do not feel she needs imaging. Anticipate  discharge after fluids, urine and hcg return and PO trial.  Please see her note for completion of care.   Final Clinical Impression(s) / ED Diagnoses Final diagnoses:  None    Rx / DC Orders ED Discharge Orders     None

## 2022-06-08 ENCOUNTER — Emergency Department (HOSPITAL_COMMUNITY)
Admission: EM | Admit: 2022-06-08 | Discharge: 2022-06-08 | Disposition: A | Discharge status: Home / Self Care | Payer: Medicaid Other | Attending: Emergency Medicine | Admitting: Emergency Medicine

## 2022-06-08 ENCOUNTER — Other Ambulatory Visit: Payer: Self-pay

## 2022-06-08 DIAGNOSIS — G43909 Migraine, unspecified, not intractable, without status migrainosus: Secondary | ICD-10-CM | POA: Insufficient documentation

## 2022-06-08 LAB — COMPREHENSIVE METABOLIC PANEL
ALT: 13 U/L (ref 0–44)
AST: 19 U/L (ref 15–41)
Albumin: 3.9 g/dL (ref 3.5–5.0)
Alkaline Phosphatase: 57 U/L (ref 38–126)
Anion gap: 9 (ref 5–15)
BUN: 8 mg/dL (ref 6–20)
CO2: 24 mmol/L (ref 22–32)
Calcium: 9.3 mg/dL (ref 8.9–10.3)
Chloride: 104 mmol/L (ref 98–111)
Creatinine, Ser: 0.82 mg/dL (ref 0.44–1.00)
GFR, Estimated: >60 mL/min (ref >60)
Glucose, Bld: 102 mg/dL — ABNORMAL HIGH (ref 70–99)
Potassium: 3.7 mmol/L (ref 3.5–5.1)
Sodium: 137 mmol/L (ref 135–145)
Total Bilirubin: 0.6 mg/dL (ref 0.3–1.2)
Total Protein: 7.7 g/dL (ref 6.5–8.1)

## 2022-06-08 LAB — CBC
HCT: 38.3 % (ref 36.0–46.0)
Hemoglobin: 12.3 g/dL (ref 12.0–15.0)
MCH: 27.2 pg (ref 26.0–34.0)
MCHC: 32.1 g/dL (ref 30.0–36.0)
MCV: 84.7 fL (ref 80.0–100.0)
Platelets: 334 10*3/uL (ref 150–400)
RBC: 4.52 MIL/uL (ref 3.87–5.11)
RDW: 14.4 % (ref 11.5–15.5)
WBC: 7.6 10*3/uL (ref 4.0–10.5)
nRBC: 0 % (ref 0.0–0.2)

## 2022-06-08 LAB — URINALYSIS, ROUTINE W REFLEX MICROSCOPIC
Bilirubin Urine: NEGATIVE
Glucose, UA: NEGATIVE mg/dL
Hgb urine dipstick: NEGATIVE
Ketones, ur: NEGATIVE mg/dL
Nitrite: NEGATIVE
Protein, ur: NEGATIVE mg/dL
Specific Gravity, Urine: 1.013 (ref 1.005–1.030)
pH: 8 (ref 5.0–8.0)

## 2022-06-08 LAB — LIPASE, BLOOD: Lipase: 28 U/L (ref 11–51)

## 2022-06-08 LAB — HCG, SERUM, QUALITATIVE: Preg, Serum: NEGATIVE

## 2022-06-08 MED ORDER — KETOROLAC TROMETHAMINE 15 MG/ML IJ SOLN
15.0000 mg | Freq: Once | INTRAMUSCULAR | Status: AC
  Administered 2022-06-08: 15 mg via INTRAVENOUS
  Filled 2022-06-08: qty 1

## 2022-06-08 MED ORDER — METOCLOPRAMIDE HCL 5 MG/ML IJ SOLN
10.0000 mg | Freq: Once | INTRAMUSCULAR | Status: AC
  Administered 2022-06-08: 10 mg via INTRAVENOUS
  Filled 2022-06-08: qty 2

## 2022-06-08 MED ORDER — LACTATED RINGERS IV BOLUS
1000.0000 mL | Freq: Once | INTRAVENOUS | Status: AC
  Administered 2022-06-08: 1000 mL via INTRAVENOUS

## 2022-06-08 MED ORDER — DIPHENHYDRAMINE HCL 25 MG PO CAPS
25.0000 mg | ORAL_CAPSULE | Freq: Once | ORAL | Status: AC
  Administered 2022-06-08: 25 mg via ORAL
  Filled 2022-06-08: qty 1

## 2022-06-08 MED ORDER — DEXAMETHASONE SODIUM PHOSPHATE 4 MG/ML IJ SOLN
4.0000 mg | Freq: Once | INTRAMUSCULAR | Status: AC
  Administered 2022-06-08: 4 mg via INTRAVENOUS
  Filled 2022-06-08: qty 1

## 2022-06-08 MED ORDER — METOCLOPRAMIDE HCL 10 MG PO TABS: 10.0000 mg | ORAL_TABLET | Freq: Four times a day (QID) | ORAL | 0 refills | Status: AC

## 2022-06-08 NOTE — ED Triage Notes (Signed)
Pt stating for the past week she has had a migraine, mainly felt just above her eyes and temples. Takes imitrex for migraines, it is not working. Onset of vomiting and diarrhea today.

## 2022-06-08 NOTE — ED Provider Notes (Signed)
[place] [facility]PARTMENT AT [facility] Provider Note   CSN: [id number] Arrival date & time: 06/08/22  0121     History  Chief Complaint  Patient presents with   Headache    Emma Stewart is a 22 y.o. female.   Headache  Patient is a 22 year old female with past medical history significant for migraines she states she is seeing a neurologist in the past and has had CT imaging of her head years ago but she was lost to follow-up this was all done in [place].  She states that over the past week she has had more frequent left-sided headaches consistent with her typical migraine presentation.  She denies any lightheadedness or dizziness.  No chest pain.  She says that she has a couple of episodes of nonbloody nonbilious emesis today.  States that she has some achy abdominal pain.  She states that her headache is her primary issue however.  She states she has had some watery diarrhea today but no blood in her stool no fevers.  No urinary frequency urgency dysuria or hematuria     Home Medications Prior to Admission medications   Medication Sig Start Date End Date Taking? Authorizing Provider  SUMAtriptan (IMITREX) 100 MG tablet Take 100 mg by mouth every 2 (two) hours as needed for migraine or headache. 04/02/22  Yes [provider]  dicyclomine (BENTYL) 10 MG capsule Take 1 capsule (10 mg total) by mouth 3 (three) times daily as needed for spasms (abdominal pain). Patient not taking: Reported on 06/08/2022 04/14/21   Arnaldo Natal, NP  diphenoxylate-atropine (LOMOTIL) 2.5-0.025 MG tablet Take 1-2 tablets by mouth 4 (four) times daily as needed for diarrhea or loose stools. Patient not taking: Reported on 06/08/2022 08/18/20   Gilda Crease, MD  famotidine (PEPCID) 20 MG tablet Take 1 tablet (20 mg total) by mouth at bedtime. Patient not taking: Reported on 06/08/2022 04/14/21   Arnaldo Natal, NP  omeprazole (PRILOSEC) 40 MG capsule  Take 1 capsule (40 mg total) by mouth daily. 11/23/20 12/23/20  Army Melia A, PA-C  ondansetron (ZOFRAN-ODT) 8 MG disintegrating tablet Take 1 tablet (8 mg total) by mouth every 8 (eight) hours as needed for nausea or vomiting. Patient not taking: Reported on 06/08/2022 04/18/22   Antony Madura, PA-C      Allergies    Patient has no known allergies.    Review of Systems   Review of Systems  Neurological:  Positive for headaches.    Physical Exam Updated Vital Signs BP 119/63   Pulse 62   Temp 97.8 F (36.6 C) (Oral)   Resp 16   Ht 5\' 3"  (1.6 m)   Wt 88.5 kg   LMP 04/27/2022   SpO2 100%   BMI 34.54 kg/m  Physical Exam Vitals and nursing note reviewed.  Constitutional:      General: She is not in acute distress. HENT:     Head: Normocephalic and atraumatic.     Nose: Nose normal.     Mouth/Throat:     Mouth: Mucous membranes are moist.     Comments: Multiple caries, no gingival swelling or focal abscess. Normal phonation, uvula midline.  Eyes:     General: No scleral icterus. Cardiovascular:     Rate and Rhythm: Normal rate and regular rhythm.     Pulses: Normal pulses.     Heart sounds: Normal heart sounds.  Pulmonary:     Effort: Pulmonary effort is normal. No respiratory  distress.     Breath sounds: No wheezing.  Abdominal:     Palpations: Abdomen is soft.     Tenderness: There is no abdominal tenderness. There is no guarding or rebound.  Musculoskeletal:     Cervical back: Normal range of motion.     Right lower leg: No edema.     Left lower leg: No edema.  Skin:    General: Skin is warm and dry.     Capillary Refill: Capillary refill takes less than 2 seconds.  Neurological:     Mental Status: She is alert. Mental status is at baseline.     Comments: Alert and oriented to self, place, time and event.   Speech is fluent, clear without dysarthria or dysphasia.   Strength 5/5 in upper/lower extremities   Sensation intact in upper/lower extremities   CN I  not tested  CN II grossly intact visual fields bilaterally. Did not visualize posterior eye.  CN III, IV, VI PERRLA and EOMs intact bilaterally  CN V Intact sensation to sharp and light touch to the face  CN VII facial movements symmetric  CN VIII not tested  CN IX, X no uvula deviation, symmetric rise of soft palate  CN XI 5/5 SCM and trapezius strength bilaterally  CN XII Midline tongue protrusion, symmetric L/R movements   Psychiatric:        Mood and Affect: Mood normal.        Behavior: Behavior normal.     ED Results / Procedures / Treatments   Labs (all labs ordered are listed, but only abnormal results are displayed) Labs Reviewed  COMPREHENSIVE METABOLIC PANEL - Abnormal; Notable for the following components:      Result Value   Glucose, Bld 102 (*)    All other components within normal limits  CBC  LIPASE, BLOOD  HCG, SERUM, QUALITATIVE  URINALYSIS, ROUTINE W REFLEX MICROSCOPIC    EKG None  Radiology No results found.  Procedures Procedures    Medications Ordered in ED Medications  lactated ringers bolus 1,000 mL (0 mLs Intravenous Stopped 06/08/22 0443)  metoCLOPramide (REGLAN) injection 10 mg (10 mg Intravenous Given 06/08/22 0349)  diphenhydrAMINE (BENADRYL) capsule 25 mg (25 mg Oral Given 06/08/22 0349)  ketorolac (TORADOL) 15 MG/ML injection 15 mg (15 mg Intravenous Given 06/08/22 0349)  dexamethasone (DECADRON) injection 4 mg (4 mg Intravenous Given 06/08/22 0349)    ED Course/ Medical Decision Making/ A&P Clinical Course as of 06/08/22 0551  Mon Jun 08, 2022  0343 Several days similar to prior migraines.  Nausea, one ep of vomiting. Diarrhea.  L sideed [WF]    Clinical Course User Index [WF] Gailen Shelter, PA                             Medical Decision Making Amount and/or Complexity of Data Reviewed Labs: ordered.  Risk Prescription drug management.   This patient presents to the ED for concern of HA, this involves a number of  treatment options, and is a complaint that carries with it a moderate risk of complications and morbidity. A differential diagnosis was considered for the patient's symptoms which is discussed below:   Emergent considerations for headache include subarachnoid hemorrhage, meningitis, temporal arteritis, glaucoma, cerebral ischemia, carotid/vertebral dissection, intracranial tumor, Venous sinus thrombosis, carbon monoxide poisoning, acute or chronic subdural hemorrhage.  Other considerations include: Migraine, Cluster headache, Hypertension, Caffeine, alcohol, or drug withdrawal, Pseudotumor cerebri, Arteriovenous malformation, Head  injury, Neurocysticercosis, Post-lumbar puncture, Preeclampsia, Tension headache, Sinusitis, Cervical arthritis, Refractive error causing strain, Dental abscess, Otitis media, Temporomandibular joint syndrome, Depression, Somatoform disorder (eg, somatization) Trigeminal neuralgia, Glossopharyngeal neuralgia.    Co morbidities: Discussed in HPI   Brief History:  Patient is a 22 year old female with past medical history significant for migraines she states she is seeing a neurologist in the past and has had CT imaging of her head years ago but she was lost to follow-up this was all done in [place].  She states that over the past week she has had more frequent left-sided headaches consistent with her typical migraine presentation.  She denies any lightheadedness or dizziness.  No chest pain.  She says that she has a couple of episodes of nonbloody nonbilious emesis today.  States that she has some achy abdominal pain.  She states that her headache is her primary issue however.  She states she has had some watery diarrhea today but no blood in her stool no fevers.  No urinary frequency urgency dysuria or hematuria   Did use Imitrex prior to arrival without significant improvement    EMR reviewed including pt PMHx, past surgical history and past visits to ER.   See HPI  for more details   Lab Tests:   CBC CMP lipase all reassuring, i-STAT hCG negative for pregnancy. Urine unremarkable    Imaging Studies:  No imaging studies ordered for this patient    Cardiac Monitoring:  NA NA   Medicines ordered:  I ordered medication including Decadron, lactated Ringer's, Benadryl, Toradol, Reglan for HA Reevaluation of the patient after these medicines showed that the patient resolved I have reviewed the patients home medicines and have made adjustments as needed   Critical Interventions:     Consults/Attending Physician     Social Determinants of Health:  The patient's social determinants of health were a factor in the care of this patient does not have PCP, no insurance currently will refer to low income clinic    Problem List / ED Course:  History of migraine headaches patient states her symptoms today are similar to these.  She has been lost to follow-up with neurology but has been formally diagnosed with migraines by neurology in the past and takes sumatriptan hand.  This was ineffective at home.  Headache is completely resolved my reevaluation.  Dispostion:  After consideration of the diagnostic results and the patients response to treatment, I feel that the patent would benefit from discharge.    Final Clinical Impression(s) / ED Diagnoses Final diagnoses:  Migraine without status migrainosus, not intractable, unspecified migraine type    Rx / DC Orders ED Discharge Orders     None         Gailen Shelter, [place] 06/08/22 1610    Tilden Fossa, MD 06/08/22 [phone]

## 2022-06-08 NOTE — ED Notes (Signed)
Pt reports feeling better and that she'd like to be discharged. PA updated.

## 2022-06-08 NOTE — Discharge Instructions (Signed)
Make sure you are drinking plenty of water, eating regular meals, follow-up with a neurologist that given the information for an office.  Call to make an appointment.  I given you some Reglan to use for headaches as needed as prescribed.  Take with the 25 mg tablet of benadryl

## 2022-07-22 IMAGING — DX DG CHEST 1V PORT
1 series · 1 of 1 positions shown · non-contrast
Comparison: None.

CLINICAL DATA: Cough. Abdominal pain, nausea and vomiting with
diarrhea.

EXAM:
PORTABLE CHEST 1 VIEW

[chest ap]
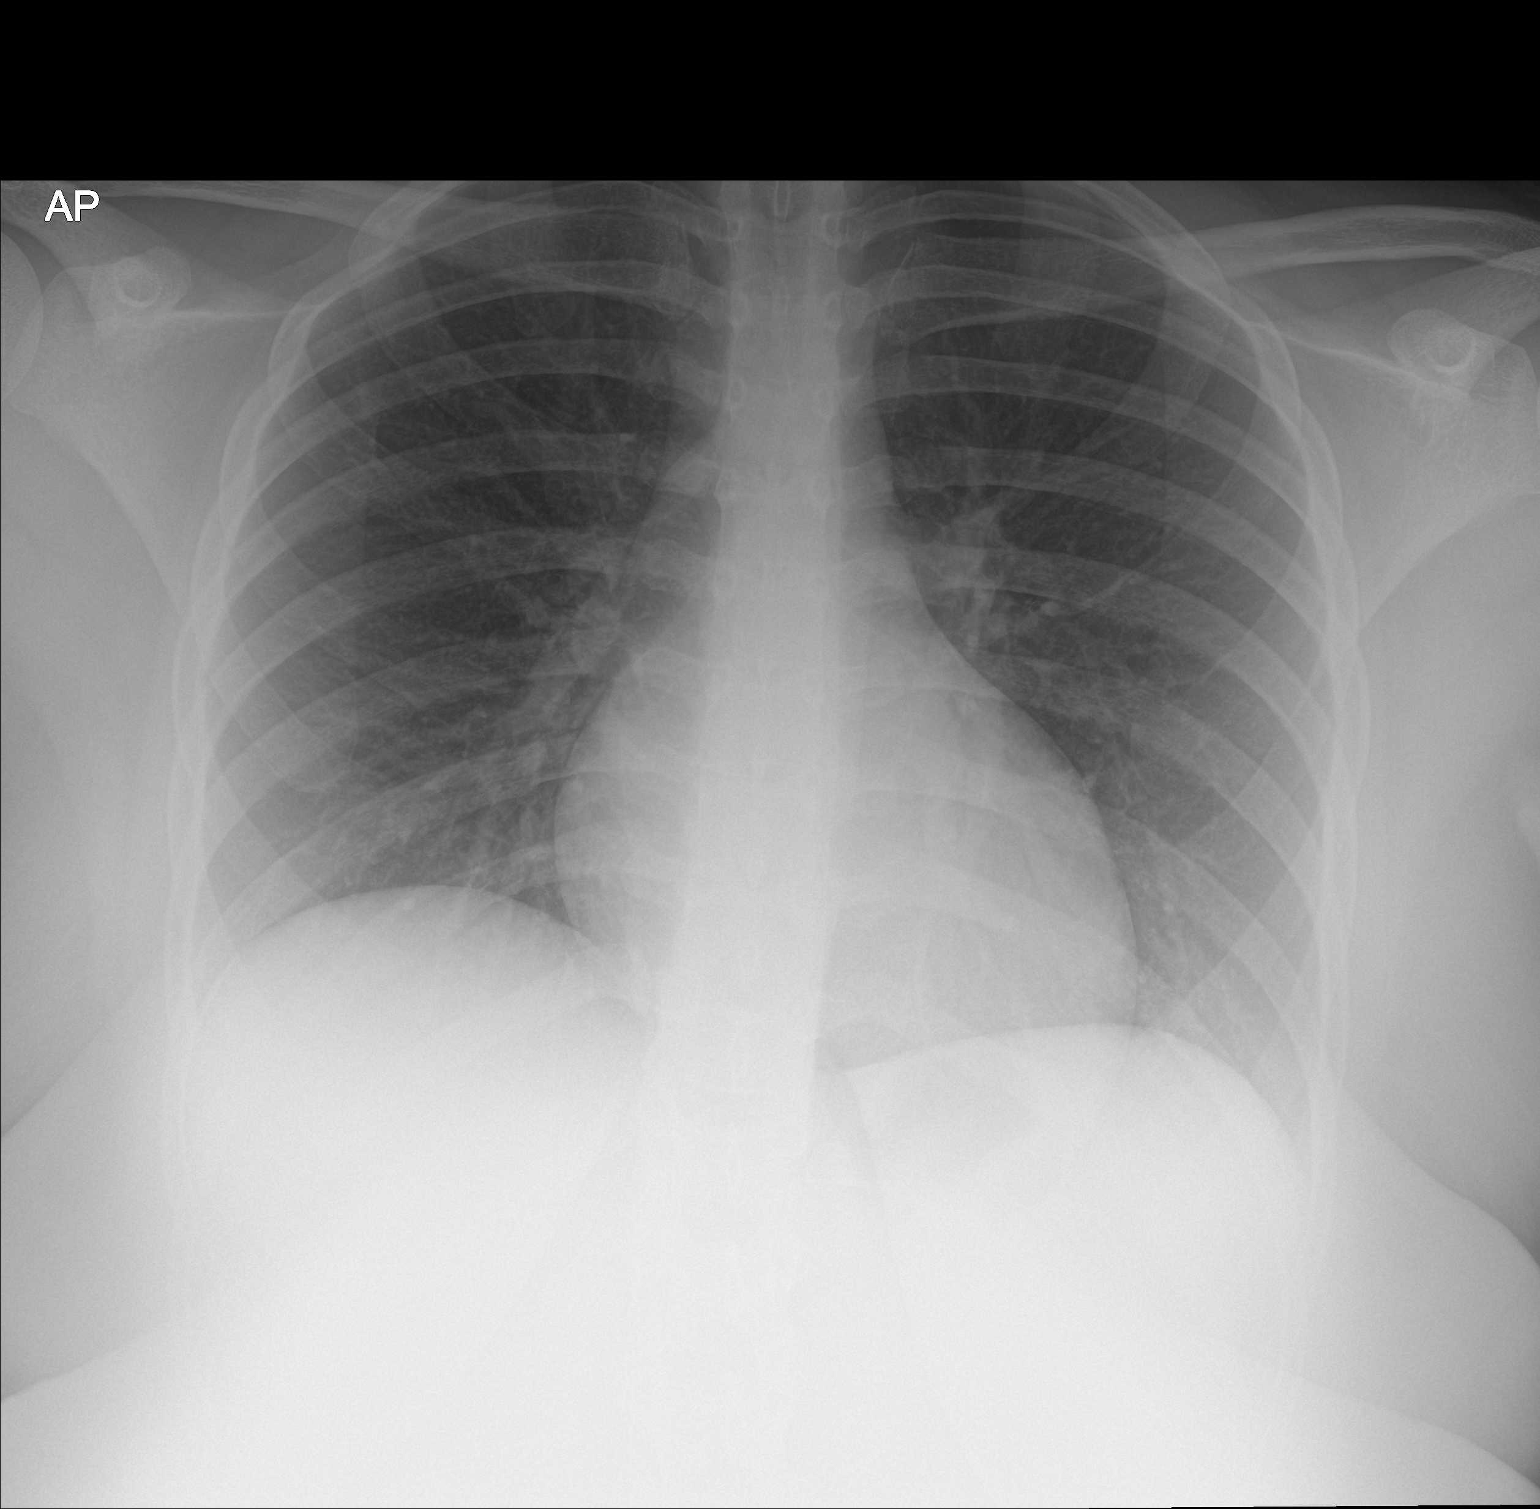

[1 of 1 positions shown; findings below may reference images not displayed]

FINDINGS: The heart size and mediastinal contours are within normal limits.
Both lungs are clear. The visualized skeletal structures are
unremarkable.
IMPRESSION: Normal examination.
